# Patient Record
Sex: Female | Born: 1995 | Race: White | Hispanic: No | Marital: Single | Smoking: Never smoker
Health system: Southern US, Community
[De-identification: ages and names within clinical notes are randomized; demographics above are authoritative.]

## PROBLEM LIST (undated history)

## (undated) DIAGNOSIS — F329 Major depressive disorder, single episode, unspecified: Secondary | ICD-10-CM

## (undated) DIAGNOSIS — F32A Depression, unspecified: Secondary | ICD-10-CM

## (undated) DIAGNOSIS — K5900 Constipation, unspecified: Secondary | ICD-10-CM

---
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
## 2014-06-29 ENCOUNTER — Emergency Department: Payer: Self-pay | Admitting: Emergency Medicine

## 2014-08-20 ENCOUNTER — Ambulatory Visit
Admit: 2014-08-20 | Disposition: A | Discharge status: Home / Self Care | Payer: Self-pay | Attending: Family Medicine | Admitting: Family Medicine

## 2014-12-19 ENCOUNTER — Encounter: Payer: Self-pay | Admitting: Emergency Medicine

## 2014-12-19 ENCOUNTER — Emergency Department: Payer: BLUE CROSS/BLUE SHIELD

## 2014-12-19 ENCOUNTER — Emergency Department
Admission: EM | Admit: 2014-12-19 | Discharge: 2014-12-19 | Disposition: A | Discharge status: Home / Self Care | Payer: BLUE CROSS/BLUE SHIELD | Attending: Emergency Medicine | Admitting: Emergency Medicine

## 2014-12-19 DIAGNOSIS — Y9289 Other specified places as the place of occurrence of the external cause: Secondary | ICD-10-CM | POA: Diagnosis not present

## 2014-12-19 DIAGNOSIS — S93402A Sprain of unspecified ligament of left ankle, initial encounter: Secondary | ICD-10-CM | POA: Diagnosis not present

## 2014-12-19 DIAGNOSIS — Z793 Long term (current) use of hormonal contraceptives: Secondary | ICD-10-CM | POA: Diagnosis not present

## 2014-12-19 DIAGNOSIS — Y9301 Activity, walking, marching and hiking: Secondary | ICD-10-CM | POA: Diagnosis not present

## 2014-12-19 DIAGNOSIS — Z79899 Other long term (current) drug therapy: Secondary | ICD-10-CM | POA: Insufficient documentation

## 2014-12-19 DIAGNOSIS — X58XXXA Exposure to other specified factors, initial encounter: Secondary | ICD-10-CM | POA: Diagnosis not present

## 2014-12-19 DIAGNOSIS — S99912A Unspecified injury of left ankle, initial encounter: Secondary | ICD-10-CM | POA: Diagnosis present

## 2014-12-19 DIAGNOSIS — Y998 Other external cause status: Secondary | ICD-10-CM | POA: Diagnosis not present

## 2014-12-19 MED ORDER — TRAMADOL HCL 50 MG PO TABS: 50.0000 mg | ORAL_TABLET | Freq: Four times a day (QID) | ORAL | Status: AC | PRN

## 2014-12-19 MED ORDER — NAPROXEN 500 MG PO TABS: 500.0000 mg | ORAL_TABLET | Freq: Two times a day (BID) | ORAL | Status: AC

## 2014-12-19 NOTE — ED Notes (Signed)
Pt says she was leaving for work this am and fell off her porch; c/o left ankle pain; felt and heard cracking sensation/noise; history of fracture to same ankle

## 2014-12-19 NOTE — ED Provider Notes (Signed)
[facility] Emergency Department Provider Note ____________________________________________  Time seen: Approximately 7:31 AM  I have reviewed the triage vital signs and the nursing notes.   HISTORY  Chief Complaint Ankle Pain   HPI Kristen Myers is a 19 y.o. female who presents to the emergency department for evaluation of left ankle pain. She twisted her ankle while walking down her steps this morning when leaving for work. She has had a previous injury to this ankle. She felt a cracking feeling. Pain is on the outside of the ankle.  History reviewed. No pertinent past medical history.  There are no active problems to display for this patient.   History reviewed. No pertinent past surgical history.  Current Outpatient Rx  Name  Route  Sig  Dispense  Refill  . fosinopril (MONOPRIL) 10 MG tablet   Oral   Take 10 mg by mouth daily.         . norgestimate-ethinyl estradiol (ORTHO-CYCLEN,SPRINTEC,PREVIFEM) 0.25-35 MG-MCG tablet   Oral   Take 1 tablet by mouth daily.         . naproxen (NAPROSYN) 500 MG tablet   Oral   Take 1 tablet (500 mg total) by mouth 2 (two) times daily with a meal.   60 tablet   2   . traMADol (ULTRAM) 50 MG tablet   Oral   Take 1 tablet (50 mg total) by mouth every 6 (six) hours as needed.   9 tablet   0     Allergies Review of patient's allergies indicates no known allergies.  History reviewed. No pertinent family history.  Social History Social History  Substance Use Topics  . Smoking status: Never Smoker   . Smokeless tobacco: None  . Alcohol Use: Yes    Review of Systems Constitutional: No recent illness. Eyes: No visual changes. ENT: No sore throat. Cardiovascular: Denies chest pain or palpitations. Respiratory: Denies shortness of breath. Gastrointestinal: No abdominal pain.  Genitourinary: Negative for dysuria. Musculoskeletal: Pain in left ankle Skin: Negative for rash. Neurological:  Negative for headaches, focal weakness or numbness. 10-point ROS otherwise negative.  ____________________________________________   PHYSICAL EXAM:  VITAL SIGNS: ED Triage Vitals  Enc Vitals Group     BP 12/19/14 0637 129/81 mmHg     Pulse Rate 12/19/14 0637 84     Resp 12/19/14 0637 18     Temp 12/19/14 0637 97.7 F (36.5 C)     Temp Source 12/19/14 0637 Oral     SpO2 12/19/14 0637 100 %     Weight 12/19/14 0637 210 lb (95.255 kg)     Height 12/19/14 0637  (1.651 m)     Head Cir --      Peak Flow --      Pain Score 12/19/14 0637 8     Pain Loc --      Pain Edu? --      Excl. in GC? --     Constitutional: Alert and oriented. Well appearing and in no acute distress. Eyes: Conjunctivae are normal. EOMI. Head: Atraumatic. Nose: No congestion/rhinnorhea. Neck: No stridor.  Respiratory: Normal respiratory effort.   Musculoskeletal: Left ankle swelling and tenderness to the lateral aspect. Neurologic:  Normal speech and language. No gross focal neurologic deficits are appreciated. Speech is normal. No gait instability. Skin:  Skin is warm, dry and intact. Atraumatic. Psychiatric: Mood and affect are normal. Speech and behavior are normal.  ____________________________________________   LABS (all labs ordered are listed, but only  abnormal results are displayed)  Labs Reviewed - No data to display ____________________________________________  RADIOLOGY  Left ankle negative for acute bony abnormality.  I, Kem Boroughs, personally viewed and evaluated these images (plain radiographs) as part of my medical decision making.   ____________________________________________   PROCEDURES  Procedure(s) performed:  SPLINT APPLICATION Date/Time: 7:42 AM Authorized by: Kem Boroughs Consent: Verbal consent obtained. Risks and benefits: risks, benefits and alternatives were discussed Consent given by: patient Splint applied by: Council Mechanic, ER technician Location details:  Left ankle Splint type: Ankle stirrup Supplies used: Velcro prefabricated splint. Post-procedure: The splinted body part was neurovascularly unchanged following the procedure. Patient tolerance: Patient tolerated the procedure well with no immediate complications.      ____________________________________________   INITIAL IMPRESSION / ASSESSMENT AND PLAN / ED COURSE  Pertinent labs & imaging results that were available during my care of the patient were reviewed by me and considered in my medical decision making (see chart for details).  Patient was advised to follow up with orthopedics for symptoms that are not improving over the week. She was advised to return to the ER for symptoms that change or worsen if unable to see the specialist. ____________________________________________   FINAL CLINICAL IMPRESSION(S) / ED DIAGNOSES  Final diagnoses:  Ankle sprain, left, initial encounter      Chinita Pester, FNP 12/19/14 4098  Jennye Moccasin, MD 12/19/14 1344

## 2014-12-19 NOTE — Discharge Instructions (Signed)

## 2015-08-09 ENCOUNTER — Emergency Department
Admission: EM | Admit: 2015-08-09 | Discharge: 2015-08-09 | Disposition: A | Discharge status: Home / Self Care | Payer: BLUE CROSS/BLUE SHIELD | Attending: Emergency Medicine | Admitting: Emergency Medicine

## 2015-08-09 ENCOUNTER — Emergency Department: Payer: BLUE CROSS/BLUE SHIELD

## 2015-08-09 ENCOUNTER — Encounter: Payer: Self-pay | Admitting: Emergency Medicine

## 2015-08-09 DIAGNOSIS — F329 Major depressive disorder, single episode, unspecified: Secondary | ICD-10-CM | POA: Diagnosis not present

## 2015-08-09 DIAGNOSIS — Z79899 Other long term (current) drug therapy: Secondary | ICD-10-CM | POA: Insufficient documentation

## 2015-08-09 DIAGNOSIS — K5900 Constipation, unspecified: Secondary | ICD-10-CM | POA: Diagnosis not present

## 2015-08-09 HISTORY — DX: Constipation, unspecified: K59.00

## 2015-08-09 HISTORY — DX: Major depressive disorder, single episode, unspecified: F32.9

## 2015-08-09 HISTORY — DX: Depression, unspecified: F32.A

## 2015-08-09 LAB — POCT PREGNANCY, URINE: PREG TEST UR: NEGATIVE

## 2015-08-09 MED ORDER — MAGNESIUM CITRATE PO SOLN: 1.0000 | Freq: Once | ORAL | Status: AC

## 2015-08-09 NOTE — Discharge Instructions (Signed)

## 2015-08-09 NOTE — ED Provider Notes (Signed)
[facility] Emergency Department Provider Note ____________________________________________  Time seen: Approximately 2:32 PM  I have reviewed the triage vital signs and the nursing notes.   HISTORY  Chief Complaint Constipation    HPI Kristen AriasKristian T Mayhan is a 20 y.o. female, NAD, presents to emergency department for two week history of constipation. In this time she states she has not had a bowel movement even with addition of miralax to her regimen. She denies history of ulcerative colitis and chron's. She complains of abdominal pain.   Past Medical History  Diagnosis Date  . Constipation   . Depression     There are no active problems to display for this patient.   History reviewed. No pertinent past surgical history.  Current Outpatient Rx  Name  Route  Sig  Dispense  Refill  . fosinopril (MONOPRIL) 10 MG tablet   Oral   Take 10 mg by mouth daily.         . magnesium citrate SOLN   Oral   Take 296 mLs (1 Bottle total) by mouth once.   195 mL   0   . naproxen (NAPROSYN) 500 MG tablet   Oral   Take 1 tablet (500 mg total) by mouth 2 (two) times daily with a meal.   60 tablet   2   . norgestimate-ethinyl estradiol (ORTHO-CYCLEN,SPRINTEC,PREVIFEM) 0.25-35 MG-MCG tablet   Oral   Take 1 tablet by mouth daily.         . traMADol (ULTRAM) 50 MG tablet   Oral   Take 1 tablet (50 mg total) by mouth every 6 (six) hours as needed.   9 tablet   0     Allergies Review of patient's allergies indicates no known allergies.  History reviewed. No pertinent family history.  Social History Social History  Substance Use Topics  . Smoking status: Never Smoker   . Smokeless tobacco: None  . Alcohol Use: Yes    Review of Systems Constitutional: No fever/chills Eyes: No visual changes. ENT: No sore throat. Cardiovascular: Denies chest pain. Respiratory: Denies shortness of breath. Gastrointestinal: Positive for diffuse abdominal pain.   Positive for nausea and constipation.  No vomiting or diarrhea.  Genitourinary: Negative for dysuria. Musculoskeletal: Negative for back pain. Skin: Negative for rash. Neurological: Negative for headaches, focal weakness or numbness.  10-point ROS otherwise negative.  ____________________________________________   PHYSICAL EXAM:  VITAL SIGNS: ED Triage Vitals  Enc Vitals Group     BP 08/09/15 1041 135/78 mmHg     Pulse Rate 08/09/15 1041 98     Resp 08/09/15 1041 18     Temp 08/09/15 1041 98.3 F (36.8 C)     Temp Source 08/09/15 1041 Oral     SpO2 08/09/15 1041 99 %     Weight 08/09/15 1041 210 lb (95.255 kg)     Height --      Head Cir --      Peak Flow --      Pain Score 08/09/15 1042 7     Pain Loc --      Pain Edu? --      Excl. in GC? --    Constitutional: Alert and oriented. Well appearing and in no acute distress. Head: Atraumatic. Hematological/Lymphatic/Immunilogical: No cervical lymphadenopathy. Cardiovascular: Normal rate, regular rhythm. Grossly normal heart sounds.  Good peripheral circulation. Respiratory: Normal respiratory effort.  No retractions. Lungs CTAB. Gastrointestinal: Soft and nontender. No distention. No abdominal bruits. No CVA tenderness. Normoactive bowel sounds.  Musculoskeletal: No lower extremity tenderness nor edema.  No joint effusions. Neurologic:  Normal speech and language. No gross focal neurologic deficits are appreciated. No gait instability. Skin:  Skin is warm, dry and intact. No rash noted. Psychiatric: Mood and affect are normal. Speech and behavior are normal.  ____________________________________________   LABS (all labs ordered are listed, but only abnormal results are displayed)  Labs Reviewed  POC URINE PREG, ED  POCT PREGNANCY, URINE   ____________________________________________   RADIOLOGY EXAM: ABDOMEN - 1 VIEW  COMPARISON: None.  FINDINGS: Bowel gas pattern is normal. Colonic stool volume is average.  No unexpected abdominal pelvic calcification. There is no unexpected abdominal pelvic calcification.  IMPRESSION: Normal exam. ____________________________________________   PROCEDURES  Procedure(s) performed: None  Critical Care performed: No  ____________________________________________   INITIAL IMPRESSION / ASSESSMENT AND PLAN / ED COURSE  Pertinent labs & imaging results that were available during my care of the patient were reviewed by me and considered in my medical decision making (see chart for details).  Constipation. Prescribed magnesium citrate for relief. Advised to return to PCP if symptoms persist or worsen. ____________________________________________   FINAL CLINICAL IMPRESSION(S) / ED DIAGNOSES  Final diagnoses:  Constipation, unspecified constipation type     This chart was dictated using voice recognition software/Dragon. Despite best efforts to proofread, errors can occur which can change the meaning. Any change was purely unintentional.   Evangeline Dakin, PA-C 08/09/15 1516  Myrna Blazer, MD 08/09/15 1630

## 2015-08-09 NOTE — ED Notes (Addendum)
Pt to ed with c/o constipation x 2 months,  Pt states she usually has a small bm every 2 weeks.  Denies vomiting.

## 2016-04-30 IMAGING — CR DG ABDOMEN 1V
1 series · 2 of 2 positions shown · non-contrast
Comparison: None.

CLINICAL DATA: Ongoing issues with constipation for 1 month.

EXAM:
ABDOMEN - 1 VIEW

[Series 1: t abdomen supine · 0.14mm/px · 2 of 2 slices shown]
[im 1/2]
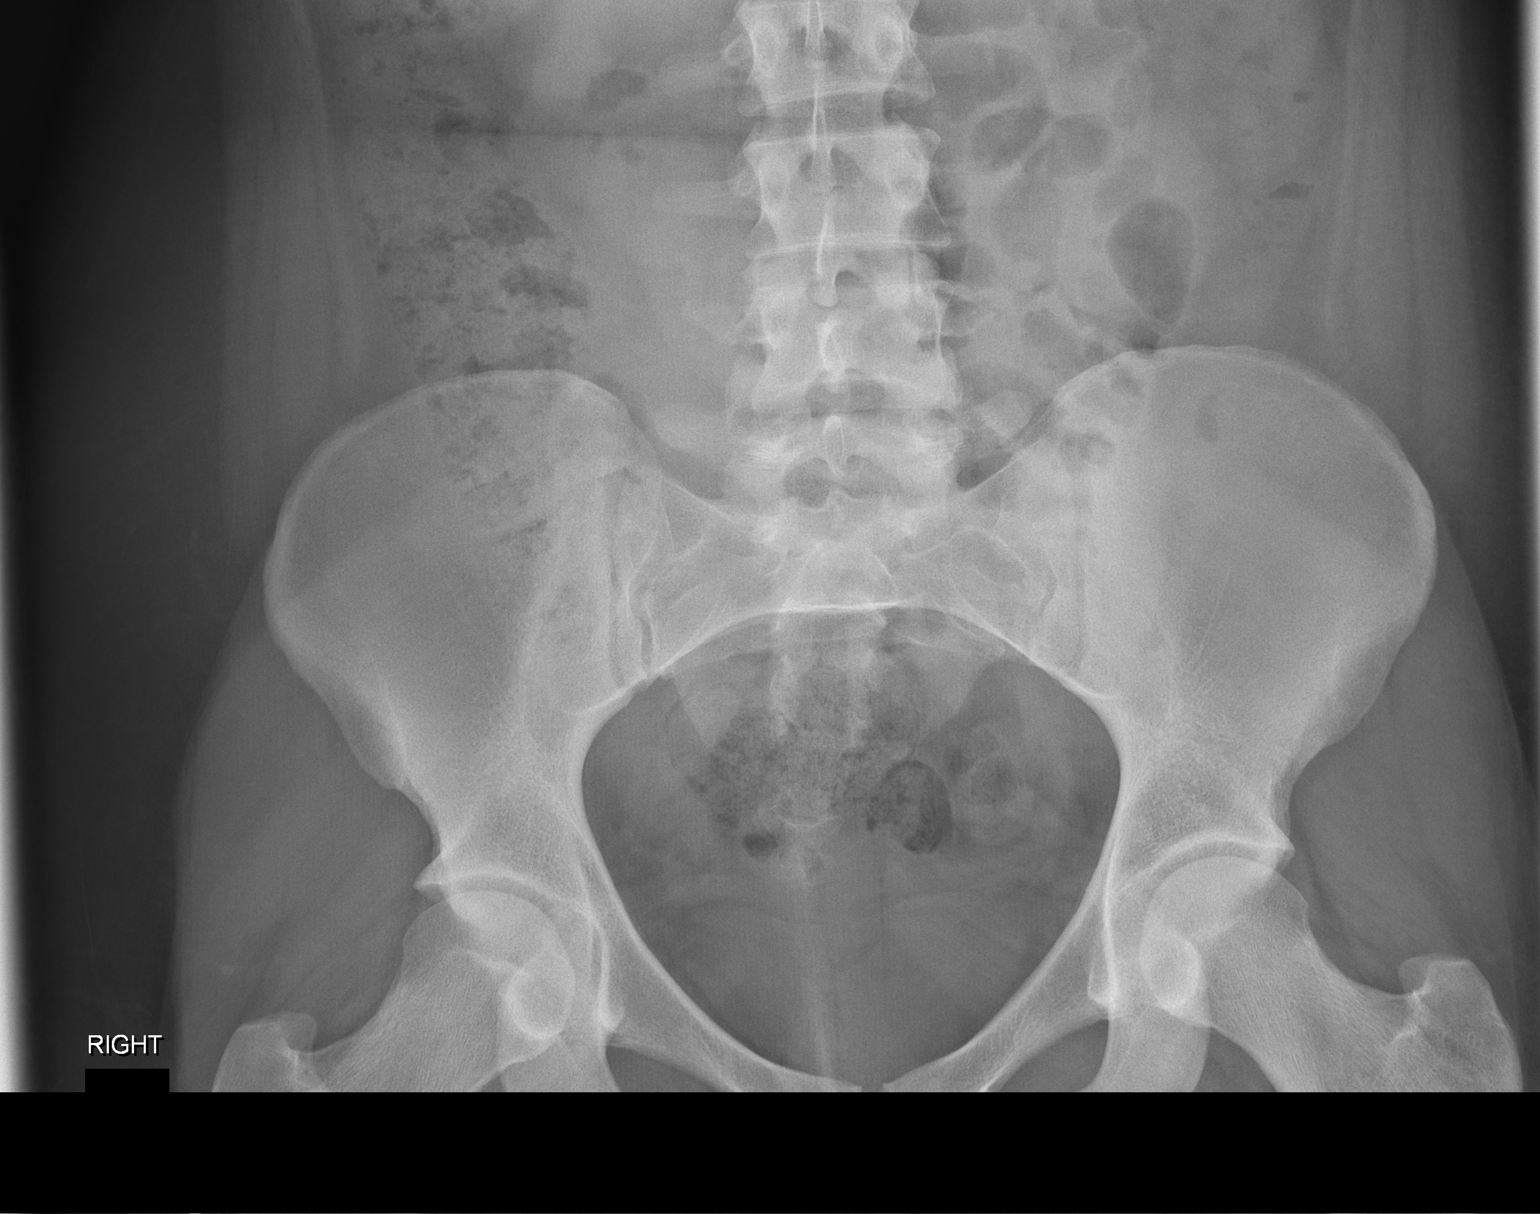
[im 2/2]
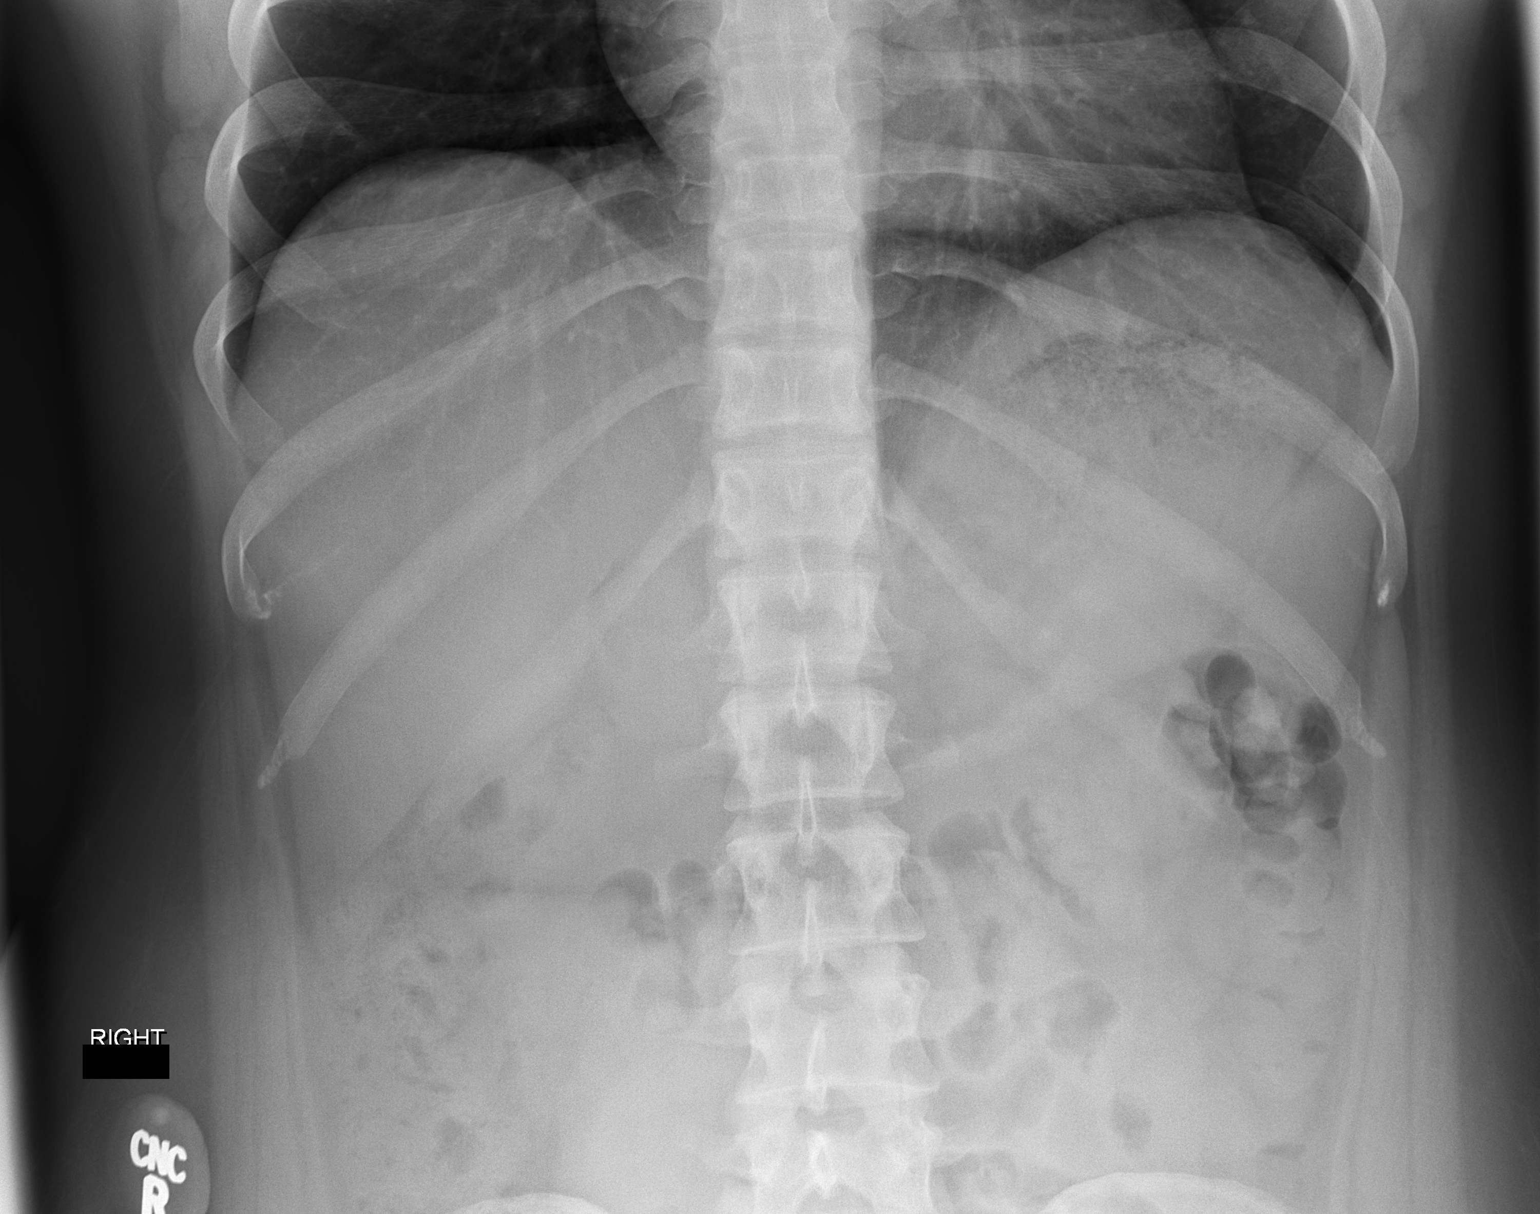

[2 of 2 positions shown; findings below may reference images not displayed]

FINDINGS: Bowel gas pattern is normal. Colonic stool volume is average. No
unexpected abdominal pelvic calcification. There is no unexpected
abdominal pelvic calcification.
IMPRESSION: Normal exam.

## 2018-02-21 ENCOUNTER — Encounter: Payer: Self-pay | Admitting: Certified Nurse Midwife

## 2018-04-18 ENCOUNTER — Encounter: Payer: Self-pay | Admitting: Emergency Medicine

## 2018-04-18 ENCOUNTER — Emergency Department
Admission: EM | Admit: 2018-04-18 | Discharge: 2018-04-18 | Disposition: A | Discharge status: Home / Self Care | Payer: BLUE CROSS/BLUE SHIELD | Attending: Emergency Medicine | Admitting: Emergency Medicine

## 2018-04-18 ENCOUNTER — Other Ambulatory Visit: Payer: Self-pay

## 2018-04-18 DIAGNOSIS — J101 Influenza due to other identified influenza virus with other respiratory manifestations: Secondary | ICD-10-CM | POA: Insufficient documentation

## 2018-04-18 DIAGNOSIS — M7918 Myalgia, other site: Secondary | ICD-10-CM | POA: Insufficient documentation

## 2018-04-18 DIAGNOSIS — Z79899 Other long term (current) drug therapy: Secondary | ICD-10-CM | POA: Insufficient documentation

## 2018-04-18 DIAGNOSIS — R05 Cough: Secondary | ICD-10-CM | POA: Insufficient documentation

## 2018-04-18 DIAGNOSIS — R509 Fever, unspecified: Secondary | ICD-10-CM | POA: Diagnosis present

## 2018-04-18 LAB — INFLUENZA PANEL BY PCR (TYPE A & B)
INFLAPCR: POSITIVE — AB
INFLBPCR: NEGATIVE

## 2018-04-18 MED ORDER — GUAIFENESIN-CODEINE 100-10 MG/5ML PO SOLN: 5.0000 mL | Freq: Four times a day (QID) | ORAL | 0 refills | Status: AC | PRN

## 2018-04-18 MED ORDER — OSELTAMIVIR PHOSPHATE 75 MG PO CAPS: 75.0000 mg | ORAL_CAPSULE | Freq: Two times a day (BID) | ORAL | 0 refills | Status: AC

## 2018-04-18 NOTE — ED Provider Notes (Signed)
[facility] Emergency Department Provider Note  ___________________________________________   First MD Initiated Contact with Patient 04/18/18 1146     (approximate)  I have reviewed the triage vital signs and the nursing notes.   HISTORY  Chief Complaint Cough  HPI Kristen Myers is a 22 y.o. female presents to the ED with complaint of sudden onset of fever, chills, body aches.  Patient reports a nonproductive cough with fever.  At this time no one else in the family is sick.  She has been taking some over-the-counter cold and flu medication without any relief.  Rates her pain as an 8 out of 10.  Past Medical History:  Diagnosis Date  . Constipation   . Depression     There are no active problems to display for this patient.   History reviewed. No pertinent surgical history.  Prior to Admission medications   Medication Sig Start Date End Date Taking? Authorizing Provider  fosinopril (MONOPRIL) 10 MG tablet Take 10 mg by mouth daily.    [provider]  guaiFENesin-codeine 100-10 MG/5ML syrup Take 5 mLs by mouth every 6 (six) hours as needed. 04/18/18   Tommi RumpsSummers, Gloriann Riede L, PA-C  magnesium citrate SOLN Take 296 mLs (1 Bottle total) by mouth once. 08/09/15   Beers, Charmayne Sheerharles M, PA-C  norgestimate-ethinyl estradiol (ORTHO-CYCLEN,SPRINTEC,PREVIFEM) 0.25-35 MG-MCG tablet Take 1 tablet by mouth daily.    [provider]  oseltamivir (TAMIFLU) 75 MG capsule Take 1 capsule (75 mg total) by mouth 2 (two) times daily for 5 days. 04/18/18 04/23/18  Tommi RumpsSummers, Elenor Wildes L, PA-C  traMADol (ULTRAM) 50 MG tablet Take 1 tablet (50 mg total) by mouth every 6 (six) hours as needed. 12/19/14   Chinita Pesterriplett, Cari B, FNP    Allergies Bupropion  History reviewed. No pertinent family history.  Social History Social History   Tobacco Use  . Smoking status: Never Smoker  . Smokeless tobacco: Never Used  Substance Use Topics  . Alcohol use: Yes  . Drug use: No      Review of Systems Constitutional: Subjective fever/chills Eyes: No visual changes. ENT: No sore throat.  Positive congestion. Cardiovascular: Denies chest pain. Respiratory: Denies shortness of breath.  Positive nonproductive cough. Gastrointestinal: No abdominal pain.  No nausea, no vomiting.  Musculoskeletal: Positive for body aches. Skin: Negative for rash. Neurological: Negative for headaches, focal weakness or numbness. ___________________________________________   PHYSICAL EXAM:  VITAL SIGNS: ED Triage Vitals [04/18/18 1118]  Enc Vitals Group     BP 126/72     Pulse Rate 69     Resp 18     Temp (!) 100.7 F (38.2 C)     Temp Source Oral     SpO2 98 %     Weight 230 lb (104.3 kg)     Height 5\' 5"  (1.651 m)     Head Circumference      Peak Flow      Pain Score 8     Pain Loc      Pain Edu?      Excl. in GC?    Constitutional: Alert and oriented. Well appearing and in no acute distress. Eyes: Conjunctivae are normal. Head: Atraumatic. Nose: Mild congestion/rhinnorhea. Mouth/Throat: Mucous membranes are moist.  Oropharynx non-erythematous. Neck: No stridor.   Hematological/Lymphatic/Immunilogical: No cervical lymphadenopathy. Cardiovascular: Normal rate, regular rhythm. Grossly normal heart sounds.  Good peripheral circulation. Respiratory: Normal respiratory effort.  No retractions. Lungs CTAB. Gastrointestinal: Soft and nontender. No distention. No abdominal bruits.  No CVA tenderness. Musculoskeletal: No lower extremity tenderness nor edema.  No joint effusions. Neurologic:  Normal speech and language. No gross focal neurologic deficits are appreciated. No gait instability. Skin:  Skin is warm, dry and intact. No rash noted. Psychiatric: Mood and affect are normal. Speech and behavior are normal.  ____________________________________________   LABS (all labs ordered are listed, but only abnormal results are displayed)  Labs Reviewed  INFLUENZA PANEL BY  PCR (TYPE A & B) - Abnormal; Notable for the following components:      Result Value   Influenza A By PCR POSITIVE (*)    All other components within normal limits   EKG:   Was reviewed by Dr. Mayford KnifeWilliams.  Patient was sinus tachycardia with a ventricular rate of 120.  PR interval 126, QRS duration 72.  Nonspecific ST abnormalities.  PROCEDURES  Procedure(s) performed: None  Procedures  Critical Care performed: No  ____________________________________________   INITIAL IMPRESSION / ASSESSMENT AND PLAN / ED COURSE  As part of my medical decision making, I reviewed the following data within the electronic MEDICAL RECORD NUMBER Notes from prior ED visits and [place] Controlled Substance Database  Patient presents to the ED with complaint of sudden onset yesterday of nonproductive cough, fever, body aches and nasal congestion.  At this time there is no other family member sick.  Patient last took an over-the-counter cold and flu medication at 7:30 AM.  Patient was febrile and EKG showed sinus tachycardia with a rate of 120.  Influenza test was positive for influenza A.  Patient was made aware.  Prescription for Tamiflu 75 mg twice daily for 5 days and guaifenesin with codeine every 6 hours as needed for cough and congestion was sent to her pharmacy.  She is encouraged to follow-up with her PCP or [facility] if any continued problems.  ____________________________________________   FINAL CLINICAL IMPRESSION(S) / ED DIAGNOSES  Final diagnoses:  Influenza A     ED Discharge Orders         Ordered    oseltamivir (TAMIFLU) 75 MG capsule  2 times daily     04/18/18 1304    guaiFENesin-codeine 100-10 MG/5ML syrup  Every 6 hours PRN     04/18/18 1304           Note:  This document was prepared using Dragon voice recognition software and may include unintentional dictation errors.    Tommi RumpsSummers, Zondra Lawlor L, PA-C 04/18/18 1312    Emily FilbertWilliams, Jonathan E, MD 04/18/18 [phone]

## 2018-04-18 NOTE — ED Triage Notes (Addendum)
Pt c/o cough since yesterday that is dry and non-productive, pt also reports fever. Pt states there is pain in her chest wall when coughing.  No sick contacts. Pt also reports nasal congestion.     Pt states she took some cold and flu medicine around 0730 this morning.

## 2018-04-18 NOTE — Discharge Instructions (Addendum)
Follow-up with your primary care provider or [facility] if any continued problems.  Begin taking Tamiflu twice a day for the next 5 days.  Robitussin-AC contains a narcotic to help with coughing.  Do not drive or operate machinery while taking this medication.  Increase fluids.  You are considered contagious as long as you are running fever.  You may take Tylenol or ibuprofen as needed for fever, body aches or headache.
